# Patient Record
Sex: Male | Born: 1990 | Race: Black or African American | Hispanic: No | Marital: Single | Smoking: Never smoker
Health system: Southern US, Community
[De-identification: ages and names within clinical notes are randomized; demographics above are authoritative.]

---

## 2012-09-28 ENCOUNTER — Encounter (HOSPITAL_COMMUNITY): Payer: Self-pay | Admitting: Emergency Medicine

## 2012-09-28 ENCOUNTER — Emergency Department (INDEPENDENT_AMBULATORY_CARE_PROVIDER_SITE_OTHER)
Admission: EM | Admit: 2012-09-28 | Discharge: 2012-09-28 | Disposition: A | Discharge status: Home / Self Care | Payer: Commercial Managed Care - PPO | Source: Home / Self Care | Attending: Family Medicine | Admitting: Family Medicine

## 2012-09-28 DIAGNOSIS — J069 Acute upper respiratory infection, unspecified: Secondary | ICD-10-CM

## 2012-09-28 MED ORDER — FEXOFENADINE HCL 180 MG PO TABS: 180.0000 mg | ORAL_TABLET | Freq: Every day | ORAL | Status: AC

## 2012-09-28 MED ORDER — IPRATROPIUM BROMIDE 0.06 % NA SOLN: 2.0000 | Freq: Four times a day (QID) | NASAL | Status: AC

## 2012-09-28 NOTE — ED Provider Notes (Signed)
History     CSN: [id number]  Arrival date & time 09/28/12  1718   First MD Initiated Contact with Patient 09/28/12 1730      Chief Complaint  Patient presents with  . URI    (Consider location/radiation/quality/duration/timing/severity/associated sxs/prior treatment) Patient is a 22 y.o. male presenting with URI. The history is provided by the patient.  URI Presenting symptoms: congestion, rhinorrhea and sore throat   Presenting symptoms: no cough and no fever   Presenting symptoms comment:  Just back from spring break in [place] city, onset of sx yest Severity:  Mild Onset quality:  Gradual Duration:  1 day Progression:  Unchanged Chronicity:  New   History reviewed. No pertinent past medical history.  History reviewed. No pertinent past surgical history.  No family history on file.  History  Substance Use Topics  . Smoking status: Never Smoker   . Smokeless tobacco: Not on file  . Alcohol Use: Yes      Review of Systems  Constitutional: Negative for fever.  HENT: Positive for congestion, sore throat, rhinorrhea and postnasal drip.   Respiratory: Negative for cough.   Cardiovascular: Negative.   Gastrointestinal: Negative.   Genitourinary: Negative.     Allergies  Review of patient's allergies indicates no known allergies.  Home Medications   Current Outpatient Rx  Name  Route  Sig  Dispense  Refill  . acetaminophen (TYLENOL) 325 MG tablet   Oral   Take 650 mg by mouth every 6 (six) hours as needed for pain.         . diphenhydrAMINE (BENADRYL) 25 MG tablet   Oral   Take 25 mg by mouth every 6 (six) hours as needed for itching.         . Pseudoeph-Doxylamine-DM-APAP (NYQUIL PO)   Oral   Take by mouth.         . fexofenadine (ALLEGRA) 180 MG tablet   Oral   Take 1 tablet (180 mg total) by mouth daily.   30 tablet   1   . ipratropium (ATROVENT) 0.06 % nasal spray   Nasal   Place 2 sprays into the nose 4 (four) times daily.   15 mL  1     BP 143/67  Pulse 68  Temp(Src) 98.1 F (36.7 C) (Oral)  Resp 16  SpO2 97%  Physical Exam  Nursing note and vitals reviewed. Constitutional: He is oriented to person, place, and time. He appears well-developed and well-nourished.  HENT:  Head: Normocephalic.  Right Ear: External ear normal.  Left Ear: External ear normal.  Mouth/Throat: Oropharynx is clear and moist. No oropharyngeal exudate.  Eyes: Conjunctivae are normal. Pupils are equal, round, and reactive to light.  Neck: Normal range of motion. Neck supple.  Cardiovascular: Regular rhythm.   Pulmonary/Chest: Effort normal and breath sounds normal.  Abdominal: Soft. Bowel sounds are normal. There is no tenderness.  Lymphadenopathy:    He has no cervical adenopathy.  Neurological: He is alert and oriented to person, place, and time.  Skin: Skin is warm and dry.    ED Course  Procedures (including critical care time)  Labs Reviewed - No data to display No results found.   1. URI (upper respiratory infection)       MDM          Linna Hoff, MD 09/28/12 [phone]

## 2012-09-28 NOTE — ED Notes (Signed)
Yesterday noted sore throat, swelling in throat, feeling weak, chest and head congestion, denies fever

## 2014-12-20 ENCOUNTER — Encounter (HOSPITAL_COMMUNITY): Payer: Self-pay | Admitting: Emergency Medicine

## 2014-12-20 ENCOUNTER — Emergency Department (INDEPENDENT_AMBULATORY_CARE_PROVIDER_SITE_OTHER)
Admission: EM | Admit: 2014-12-20 | Discharge: 2014-12-20 | Disposition: A | Discharge status: Home / Self Care | Payer: BLUE CROSS/BLUE SHIELD | Source: Home / Self Care | Attending: Family Medicine | Admitting: Family Medicine

## 2014-12-20 ENCOUNTER — Emergency Department (INDEPENDENT_AMBULATORY_CARE_PROVIDER_SITE_OTHER): Payer: BLUE CROSS/BLUE SHIELD

## 2014-12-20 DIAGNOSIS — S93402A Sprain of unspecified ligament of left ankle, initial encounter: Secondary | ICD-10-CM | POA: Diagnosis not present

## 2014-12-20 NOTE — Discharge Instructions (Signed)
Thank you for coming in today. ° ° °Acute Ankle Sprain °with Phase I Rehab °An acute ankle sprain is a partial or complete tear in one or more of the ligaments of the ankle due to traumatic injury. The severity of the injury depends on both the number of ligaments sprained and the grade of sprain. There are 3 grades of sprains.  °· A grade 1 sprain is a mild sprain. There is a slight pull without obvious tearing. There is no loss of strength, and the muscle and ligament are the correct length. °· A grade 2 sprain is a moderate sprain. There is tearing of fibers within the substance of the ligament where it connects two bones or two cartilages. The length of the ligament is increased, and there is usually decreased strength. °· A grade 3 sprain is a complete rupture of the ligament and is uncommon. °In addition to the grade of sprain, there are three types of ankle sprains.  °Lateral ankle sprains: This is a sprain of one or more of the three ligaments on the outer side (lateral) of the ankle. These are the most common sprains. °Medial ankle sprains: There is one large triangular ligament of the inner side (medial) of the ankle that is susceptible to injury. Medial ankle sprains are less common. °Syndesmosis, "high ankle," sprains: The syndesmosis is the ligament that connects the two bones of the lower leg. Syndesmosis sprains usually only occur with very severe ankle sprains. °SYMPTOMS °· Pain, tenderness, and swelling in the ankle, starting at the side of injury that may progress to the whole ankle and foot with time. °· "Pop" or tearing sensation at the time of injury. °· Bruising that may spread to the heel. °· Impaired ability to walk soon after injury. °CAUSES  °· Acute ankle sprains are caused by trauma placed on the ankle that temporarily forces or pries the anklebone (talus) out of its normal socket. °· Stretching or tearing of the ligaments that normally hold the joint in place (usually due to a twisting  injury). °RISK INCREASES WITH: °· Previous ankle sprain. °· Sports in which the foot may land awkwardly (i.e., basketball, volleyball, or soccer) or walking or running on uneven or rough surfaces. °· Shoes with inadequate support to prevent sideways motion when stress occurs. °· Poor strength and flexibility. °· Poor balance skills. °· Contact sports. °PREVENTION  °· Warm up and stretch properly before activity. °· Maintain physical fitness: °¨ Ankle and leg flexibility, muscle strength, and endurance. °¨ Cardiovascular fitness. °· Balance training activities. °· Use proper technique and have a coach correct improper technique. °· Taping, protective strapping, bracing, or high-top tennis shoes may help prevent injury. Initially, tape is best; however, it loses most of its support function within 10 to 15 minutes. °· Wear proper-fitted protective shoes (High-top shoes with taping or bracing is more effective than either alone). °· Provide the ankle with support during sports and practice activities for 12 months following injury. °PROGNOSIS  °· If treated properly, ankle sprains can be expected to recover completely; however, the length of recovery depends on the degree of injury. °· A grade 1 sprain usually heals enough in 5 to 7 days to allow modified activity and requires an average of 6 weeks to heal completely. °· A grade 2 sprain requires 6 to 10 weeks to heal completely. °· A grade 3 sprain requires 12 to 16 weeks to heal. °· A syndesmosis sprain often takes more than 3 months to heal. °RELATED   COMPLICATIONS  °· Frequent recurrence of symptoms may result in a chronic problem. Appropriately addressing the problem the first time decreases the frequency of recurrence and optimizes healing time. Severity of the initial sprain does not predict the likelihood of later instability. °· Injury to other structures (bone, cartilage, or tendon). °· A chronically unstable or arthritic ankle joint is a possibility with  repeated sprains. °TREATMENT °Treatment initially involves the use of ice, medication, and compression bandages to help reduce pain and inflammation. Ankle sprains are usually immobilized in a walking cast or boot to allow for healing. Crutches may be recommended to reduce pressure on the injury. After immobilization, strengthening and stretching exercises may be necessary to regain strength and a full range of motion. Surgery is rarely needed to treat ankle sprains. °MEDICATION  °· Nonsteroidal anti-inflammatory medications, such as aspirin and ibuprofen (do not take for the first 3 days after injury or within 7 days before surgery), or other minor pain relievers, such as acetaminophen, are often recommended. Take these as directed by your caregiver. Contact your caregiver immediately if any bleeding, stomach upset, or signs of an allergic reaction occur from these medications. °· Ointments applied to the skin may be helpful. °· Pain relievers may be prescribed as necessary by your caregiver. Do not take prescription pain medication for longer than 4 to 7 days. Use only as directed and only as much as you need. °HEAT AND COLD °· Cold treatment (icing) is used to relieve pain and reduce inflammation for acute and chronic cases. Cold should be applied for 10 to 15 minutes every 2 to 3 hours for inflammation and pain and immediately after any activity that aggravates your symptoms. Use ice packs or an ice massage. °· Heat treatment may be used before performing stretching and strengthening activities prescribed by your caregiver. Use a heat pack or a warm soak. °SEEK IMMEDIATE MEDICAL CARE IF:  °· Pain, swelling, or bruising worsens despite treatment. °· You experience pain, numbness, discoloration, or coldness in the foot or toes. °· New, unexplained symptoms develop (drugs used in treatment may produce side effects.) °EXERCISES  °PHASE I EXERCISES °RANGE OF MOTION (ROM) AND STRETCHING EXERCISES - Ankle Sprain, Acute  Phase I, Weeks 1 to 2 °These exercises may help you when beginning to restore flexibility in your ankle. You will likely work on these exercises for the 1 to 2 weeks after your injury. Once your physician, physical therapist, or athletic trainer sees adequate progress, he or she will advance your exercises. While completing these exercises, remember:  °· Restoring tissue flexibility helps normal motion to return to the joints. This allows healthier, less painful movement and activity. °· An effective stretch should be held for at least 30 seconds. °· A stretch should never be painful. You should only feel a gentle lengthening or release in the stretched tissue. °RANGE OF MOTION - Dorsi/Plantar Flexion °· While sitting with your right / left knee straight, draw the top of your foot upwards by flexing your ankle. Then reverse the motion, pointing your toes downward. °· Hold each position for __________ seconds. °· After completing your first set of exercises, repeat this exercise with your knee bent. °Repeat __________ times. Complete this exercise __________ times per day.  °RANGE OF MOTION - Ankle Alphabet °· Imagine your right / left big toe is a pen. °· Keeping your hip and knee still, write out the entire alphabet with your "pen." Make the letters as large as you can without increasing any   discomfort. °Repeat __________ times. Complete this exercise __________ times per day.  °STRENGTHENING EXERCISES - Ankle Sprain, Acute -Phase I, Weeks 1 to 2 °These exercises may help you when beginning to restore strength in your ankle. You will likely work on these exercises for 1 to 2 weeks after your injury. Once your physician, physical therapist, or athletic trainer sees adequate progress, he or she will advance your exercises. While completing these exercises, remember:  °· Muscles can gain both the endurance and the strength needed for everyday activities through controlled exercises. °· Complete these exercises as  instructed by your physician, physical therapist, or athletic trainer. Progress the resistance and repetitions only as guided. °· You may experience muscle soreness or fatigue, but the pain or discomfort you are trying to eliminate should never worsen during these exercises. If this pain does worsen, stop and make certain you are following the directions exactly. If the pain is still present after adjustments, discontinue the exercise until you can discuss the trouble with your clinician. °STRENGTH - Dorsiflexors °· Secure a rubber exercise band/tubing to a fixed object (i.e., table, pole) and loop the other end around your right / left foot. °· Sit on the floor facing the fixed object. The band/tubing should be slightly tense when your foot is relaxed. °· Slowly draw your foot back toward you using your ankle and toes. °· Hold this position for __________ seconds. Slowly release the tension in the band and return your foot to the starting position. °Repeat __________ times. Complete this exercise __________ times per day.  °STRENGTH - Plantar-flexors  °· Sit with your right / left leg extended. Holding onto both ends of a rubber exercise band/tubing, loop it around the ball of your foot. Keep a slight tension in the band. °· Slowly push your toes away from you, pointing them downward. °· Hold this position for __________ seconds. Return slowly, controlling the tension in the band/tubing. °Repeat __________ times. Complete this exercise __________ times per day.  °STRENGTH - Ankle Eversion °· Secure one end of a rubber exercise band/tubing to a fixed object (table, pole). Loop the other end around your foot just before your toes. °· Place your fists between your knees. This will focus your strengthening at your ankle. °· Drawing the band/tubing across your opposite foot, slowly, pull your little toe out and up. Make sure the band/tubing is positioned to resist the entire motion. °· Hold this position for __________  seconds. °Have your muscles resist the band/tubing as it slowly pulls your foot back to the starting position.  °Repeat __________ times. Complete this exercise __________ times per day.  °STRENGTH - Ankle Inversion °· Secure one end of a rubber exercise band/tubing to a fixed object (table, pole). Loop the other end around your foot just before your toes. °· Place your fists between your knees. This will focus your strengthening at your ankle. °· Slowly, pull your big toe up and in, making sure the band/tubing is positioned to resist the entire motion. °· Hold this position for __________ seconds. °· Have your muscles resist the band/tubing as it slowly pulls your foot back to the starting position. °Repeat __________ times. Complete this exercises __________ times per day.  °STRENGTH - Towel Curls °· Sit in a chair positioned on a non-carpeted surface. °· Place your right / left foot on a towel, keeping your heel on the floor. °· Pull the towel toward your heel by only curling your toes. Keep your heel on the floor. °·   If instructed by your physician, physical therapist, or athletic trainer, add weight to the end of the towel. °Repeat __________ times. Complete this exercise __________ times per day. °Document Released: 02/05/2005 Document Revised: 11/21/2013 Document Reviewed: 10/19/2008 °[employer]® Patient Information ©2015 [employer]. This information is not intended to replace advice given to you by your health care provider. Make sure you discuss any questions you have with your health care provider. ° ° °Acute Ankle Sprain °with Phase II Rehab °An acute ankle sprain is a partial or complete tear in one or more of the ligaments of the ankle due to traumatic injury. The severity of the injury depends on both the number of ligaments sprained and the grade of sprain. There are 3 grades of sprains. °· A grade 1 sprain is a mild sprain. There is a slight pull without obvious tearing. There is no loss of  strength, and the muscle and ligament are the correct length. °· A grade 2 sprain is a moderate sprain. There is tearing of fibers within the substance of the ligament where it connects two bones or two cartilages. The length of the ligament is increased, and there is usually decreased strength. °· A grade 3 sprain is a complete rupture of the ligament and is uncommon. °In addition to the grade of sprain, there are 3 types of ankle sprains.  °Lateral ankle sprains. This is a sprain of one or more of the 3 ligaments on the outer side (lateral) of the ankle. These are the most common sprains. °Medial ankle sprains. There is one large triangular ligament on the inner side (medial) of the ankle that is susceptible to injury. Medial ankle sprains are less common. °Syndesmosis, "high ankle," sprains. The syndesmosis is the ligament that connects the two bones of the lower leg. Syndesmosis sprains usually only occur with very severe ankle sprains. °SYMPTOMS °· Pain, tenderness, and swelling in the ankle, starting at the side of injury that may progress to the whole ankle and foot with time. °· "Pop" or tearing sensation at the time of injury. °· Bruising that may spread to the heel. °· Impaired ability to walk soon after injury. °CAUSES  °· Acute ankle sprains are caused by trauma placed on the ankle that temporarily forces or pries the anklebone (talus) out of its normal socket. °· Stretching or tearing of the ligaments that normally hold the joint in place (usually due to a twisting injury). °RISK INCREASES WITH: °· Previous ankle sprain. °· Sports in which the foot may land awkwardly (basketball, volleyball, soccer) or walking or running on uneven or rough surfaces. °· Shoes with inadequate support to prevent sideways motion when stress occurs. °· Poor strength and flexibility. °· Poor balance skills. °· Contact sports. °PREVENTION °· Warm up and stretch properly before activity. °· Maintain physical fitness: °¨ Ankle  and leg flexibility, muscle strength, and endurance. °¨ Cardiovascular fitness. °· Balance training activities. °· Use proper technique and have a coach correct improper technique. °· Taping, protective strapping, bracing, or high-top tennis shoes may help prevent injury. Initially, tape is best. However, it loses most of its support function within 10 to 15 minutes. °· Wear proper fitted protective shoes. Combining high-top shoes with taping or bracing is more effective than using either alone. °· Provide the ankle with support during sports and practice activities for 12 months following injury. °PROGNOSIS  °· If treated properly, ankle sprains can be expected to recover completely. However, the length of recovery depends on the degree of   injury. °· A grade 1 sprain usually heals enough in 5 to 7 days to allow modified activity and requires an average of 6 weeks to heal completely. °· A grade 2 sprain requires 6 to 10 weeks to heal completely. °· A grade 3 sprain requires 12 to 16 weeks to heal. °· A syndesmosis sprain often takes more than 3 months to heal. °RELATED COMPLICATIONS  °· Frequent recurrence of symptoms may result in a chronic problem. Appropriately addressing the problem the first time decreases the frequency of recurrence and optimizes healing time. Severity of initial sprain does not predict the likelihood of later instability. °· Injury to other structures (bone, cartilage, or tendon). °· Chronically unstable or arthritic ankle joint are possible with repeated sprains. °TREATMENT °Treatment initially involves the use of ice, medicine, and compression bandages to help reduce pain and inflammation. Ankle sprains are usually immobilized in a walking cast or boot to allow for healing. Crutches may be recommended to reduce pressure on the injury. After immobilization, strengthening and stretching exercises may be necessary to regain strength and a full range of motion. Surgery is rarely needed to treat  ankle sprains. °MEDICATION  °· Nonsteroidal anti-inflammatory medicines, such as aspirin and ibuprofen (do not take for the first 3 days after injury or within 7 days before surgery), or other minor pain relievers, such as acetaminophen, are often recommended. Take these as directed by your caregiver. Contact your caregiver immediately if any bleeding, stomach upset, or signs of an allergic reaction occur from these medicines. °· Ointments applied to the skin may be helpful. °· Pain relievers may be prescribed as necessary by your caregiver. Do not take prescription pain medicine for longer than 4 to 7 days. Use only as directed and only as much as you need. °HEAT AND COLD °· Cold treatment (icing) is used to relieve pain and reduce inflammation for acute and chronic cases. Cold should be applied for 10 to 15 minutes every 2 to 3 hours for inflammation and pain and immediately after any activity that aggravates your symptoms. Use ice packs or an ice massage. °· Heat treatment may be used before performing stretching and strengthening activities prescribed by your caregiver. Use a heat pack or a warm soak. °SEEK IMMEDIATE MEDICAL CARE IF:  °· Pain, swelling, or bruising worsens despite treatment. °· You experience pain, numbness, discoloration, or coldness in the foot or toes. °· New, unexplained symptoms develop. (Drugs used in treatment may produce side effects.) °EXERCISES  °PHASE II EXERCISES °RANGE OF MOTION (ROM) AND STRETCHING EXERCISES - Ankle Sprain, Acute-Phase II, Weeks 3 to 4 °After your physician, physical therapist, or athletic trainer feels your knee has made progress significant enough to begin more advanced exercises, he or she may recommend completing some of the following exercises. Although each person heals at different rates, most people will be ready for these exercises between 3 and 4 weeks after their injury. Do not begin these exercises until you have your caregiver's permission. He or she  may also advise you to continue with the exercises which you completed in Phase I of your rehabilitation. While completing these exercises, remember:  °· Restoring tissue flexibility helps normal motion to return to the joints. This allows healthier, less painful movement and activity. °· An effective stretch should be held for at least 30 seconds. °· A stretch should never be painful. You should only feel a gentle lengthening or release in the stretched tissue. °RANGE OF MOTION - Ankle Plantar Flexion  °·   Sit with your right / left leg crossed over your opposite knee. °· Use your opposite hand to pull the top of your foot and toes toward you. °· You should feel a gentle stretch on the top of your foot/ankle. Hold this position for __________. °Repeat __________ times. Complete __________ times per day.  °RANGE OF MOTION - Ankle Eversion °· Sit with your right / left ankle crossed over your opposite knee. °· Grip your foot with your opposite hand, placing your thumb on the top of your foot and your fingers across the bottom of your foot. °· Gently push your foot downward with a slight rotation so your littlest toes rise slightly °· You should feel a gentle stretch on the inside of your ankle. Hold the stretch for __________ seconds. °Repeat __________ times. Complete this exercise __________ times per day.  °RANGE OF MOTION - Ankle Inversion °· Sit with your right / left ankle crossed over your opposite knee. °· Grip your foot with your opposite hand, placing your thumb on the bottom of your foot and your fingers across the top of your foot. °· Gently pull your foot so the smallest toe comes toward you and your thumb pushes the inside of the ball of your foot away from you. °· You should feel a gentle stretch on the outside of your ankle. Hold the stretch for __________ seconds. °Repeat __________ times. Complete this exercise __________ times per day.  °STRETCH - Gastrocsoleus °· Sit with your right / left leg  extended. Holding onto both ends of a belt or towel, loop it around the ball of your foot. °· Keeping your right / left ankle and foot relaxed and your knee straight, pull your foot and ankle toward you using the belt/towel. °· You should feel a gentle stretch behind your calf or knee. Hold this position for __________ seconds. °Repeat __________ times. Complete this stretch __________ times per day.  °RANGE OF MOTION - Ankle Dorsiflexion, Active Assisted °· Remove shoes and sit on a chair that is preferably not on a carpeted surface. °· Place right / left foot under knee. Extend your opposite leg for support. °· Keeping your heel down, slide your right / left foot back toward the chair until you feel a stretch at your ankle or calf. If you do not feel a stretch, slide your bottom forward to the edge of the chair while still keeping your heel down. °· Hold this stretch for __________ seconds. °Repeat __________ times. Complete this stretch __________ times per day.  °STRETCH - Gastroc, Standing  °· Place hands on wall. °· Extend right / left leg and place a folded washcloth under the arch of your foot for support. Keep the front knee somewhat bent. °· Slightly point your toes inward on your back foot. °· Keeping your right / left heel on the floor and your knee straight, shift your weight toward the wall, not allowing your back to arch. °· You should feel a gentle stretch in the calf. Hold this position for __________ seconds. °Repeat __________ times. Complete this stretch __________ times per day. °STRETCH - Soleus, Standing °· Place hands on wall. °· Extend right / left leg and place a folded washcloth under the arch of your foot for support. Keep the front knee somewhat bent. °· Slightly point your toes inward on your back foot. °· Keep your right / left heel on the floor, bend your back knee, and slightly shift your weight over the back leg so   that you feel a gentle stretch deep in your back calf. °· Hold this  position for __________ seconds. °Repeat __________ times. Complete this stretch __________ times per day. °STRETCH - Gastrocsoleus, Standing °Note: This exercise can place a lot of stress on your foot and ankle. Please complete this exercise only if specifically instructed by your caregiver.  °· Place the ball of your right / left foot on a step, keeping your other foot firmly on the same step. °· Hold on to the wall or a rail for balance. °· Slowly lift your other foot, allowing your body weight to press your heel down over the edge of the step. °· You should feel a stretch in your right / left calf. °· Hold this position for __________ seconds. °· Repeat this exercise with a slight bend in your knee. °Repeat __________ times. Complete this stretch __________ times per day.  °STRENGTHENING EXERCISES - Ankle Sprain, Acute-Phase II °Around 3 to 4 weeks after your injury, you may progress to some of these exercises in your rehabilitation program. Do not begin these until you have your caregiver's permission. Although your condition has improved, the Phase I exercises will continue to be helpful and you may continue to complete them. As you complete strengthening exercises, remember:  °· Strong muscles with good endurance tolerate stress better. °· Do the exercises as initially prescribed by your caregiver. Progress slowly with each exercise, gradually increasing the number of repetitions and weight used under his or her guidance. °· You may experience muscle soreness or fatigue, but the pain or discomfort you are trying to eliminate should never worsen during these exercises. If this pain does worsen, stop and make certain you are following the directions exactly. If the pain is still present after adjustments, discontinue the exercise until you can discuss the trouble with your caregiver. °STRENGTH - Plantar-flexors, Standing °· Stand with your feet shoulder width apart. Steady yourself with a wall or table using as  little support as needed. °· Keeping your weight evenly spread over the width of your feet, rise up on your toes.* °· Hold this position for __________ seconds. °Repeat __________ times. Complete this exercise __________ times per day.  °*If this is too easy, shift your weight toward your right / left leg until you feel challenged. Ultimately, you may be asked to do this exercise with your right / left foot only. °STRENGTH - Dorsiflexors and Plantar-flexors, Heel/toe Walking °· Dorsiflexion: Walk on your heels only. Keep your toes as high as possible. °· Walk for ____________________ seconds/feet. °· Repeat __________ times. Complete __________ times per day. °· Plantar flexion: Walk on your toes only. Keep your heels as high as possible. °· Walk for ____________________ seconds/feet. °Repeat __________ times. Complete __________ times per day.  °BALANCE - Tandem Walking °· Place your uninjured foot on a line 2 to 4 inches wide and at least 10 feet long. °· Keeping your balance without using anything for extra support, place your right / left heel directly in front of your other foot. °· Slowly raise your back foot up, lifting from the heel to the toes, and place it directly in front of the right / left foot. °· Continue to walk along the line slowly. Walk for ____________________ feet. °Repeat ____________________ times. Complete ____________________ times per day. °BALANCE - Inversion/Eversion °Use caution, these are advanced level exercises. Do not begin them until you are advised to do so.  °· Create a balance board using a sturdy board about 1 ½   feet long and at 1 to 1 ½ feet wide and a 1 ½ inch diameter rod or pipe that is as long as the board's width. A copper pipe or a solid broomstick work well. °· Stand on a non-carpeted surface near a countertop or wall. Step onto the board so that your feet are hip-width apart and equally straddle the rod/pipe. °· Keeping your feet in place, complete these two exercises  without shifting your upper body or hips: °¨ Tip the board from side-to-side. Control the movement so the board does not forcefully strike the ground. The board should silently tap the ground. °¨ Tip the board side-to-side without striking the ground. Occasionally pause and maintain a steady position at various points. °¨ Repeat the first two exercises, but use only your right / left foot. Place your right / left foot directly over the rod/pipe. °Repeat __________ times. Complete this exercise __________ times a day. °BALANCE - Plantar/Dorsi Flexion °Use caution, these are advanced level exercises. Do not begin them until you are advised to do so.  °· Create a balance board using a sturdy board about 1 ½ feet long and at 1 to 1 ½ feet wide and a 1 ½ inch diameter rod or pipe that is as long as the board's width. A copper pipe or a solid broomstick work well. °· Stand on a non-carpeted surface near a countertop or wall. Stand on the board so that the rod/pipe runs under the arches in your feet. °· Keeping your feet in place, complete these two exercises without shifting your upper body or hips: °¨ Tip the board from side-to-side. Control the movement so the board does not forcefully strike the ground. The board should silently tap the ground. °¨ Tip the board side-to-side without striking the ground. Occasionally pause and maintain a steady position at various points. °¨ Repeat the first two exercises, but use only your right / left foot. Stand in the center of the board. °Repeat __________ times. Complete this exercise __________ times a day. °STRENGTH - Plantar-flexors, Eccentric °Note: This exercise can place a lot of stress on your foot and ankle. Please complete this exercise only if specifically instructed by your caregiver.  °· Place the balls of your feet on a step. With your hands, use only enough support from a wall or rail to keep your balance. °· Keep your knees straight and rise up on your  toes. °· Slowly shift your weight entirely to your toes and pick up your opposite foot. Gently and with controlled movement, lower your weight through your right / left foot so that your heel drops below the level of the step. You will feel a slight stretch in the back of your calf at the ending position. °· Use the healthy leg to help rise up onto the balls of both feet, then lower weight only on the right / left leg again. Build up to 15 repetitions. Then progress to 3 consecutive sets of 15 repetitions.* °· After completing the above exercise, complete the same exercise with a slight knee bend (about 30 degrees). Again, build up to 15 repetitions. Then progress to 3 consecutive sets of 15 repetitions.* °Perform this exercise __________ times per day.  °*When you easily complete 3 sets of 15, your physician, physical therapist, or athletic trainer may advise you to add resistance by wearing a backpack filled with additional weight. °Document Released: 10/27/2005 Document Revised: 09/29/2011 Document Reviewed: 10/19/2008 °[employer]® Patient Information ©2015 [employer]. This information is   not intended to replace advice given to you by your health care provider. Make sure you discuss any questions you have with your health care provider. ° °

## 2014-12-20 NOTE — ED Notes (Signed)
C/o left ankle pain onset 2 weeks; recalls inj ankle while playing basketball Sx include persistent swelling and pain Alert, steady gait. No signs of acute distress.

## 2014-12-20 NOTE — ED Provider Notes (Signed)
Marvin Fernandez is a 24 y.o. male who presents to Urgent Care today for left ankle injury. Patient suffered an inversion injury to his left ankle while playing basketball 2 weeks ago. He has continued lateral ankle pain and swelling. He has done rest ice elevation and ibuprofen which has helped a little. He uses an ankle brace which helps. Pain is worse with activity.   History reviewed. No pertinent past medical history. History reviewed. No pertinent past surgical history. History  Substance Use Topics  . Smoking status: Never Smoker   . Smokeless tobacco: Not on file  . Alcohol Use: Yes   ROS as above Medications: No current facility-administered medications for this encounter.   Current Outpatient Prescriptions  Medication Sig Dispense Refill  . acetaminophen (TYLENOL) 325 MG tablet Take 650 mg by mouth every 6 (six) hours as needed for pain.    . diphenhydrAMINE (BENADRYL) 25 MG tablet Take 25 mg by mouth every 6 (six) hours as needed for itching.    . fexofenadine (ALLEGRA) 180 MG tablet Take 1 tablet (180 mg total) by mouth daily. 30 tablet 1  . ipratropium (ATROVENT) 0.06 % nasal spray Place 2 sprays into the nose 4 (four) times daily. 15 mL 1  . Pseudoeph-Doxylamine-DM-APAP (NYQUIL PO) Take by mouth.     No Known Allergies   Exam:  BP 116/69 mmHg  Pulse 64  Temp(Src) 98.4 F (36.9 C) (Oral)  Resp 16  SpO2 96% Gen: Well NAD HEENT: EOMI,  MMM Lungs: Normal work of breathing. CTABL Heart: RRR no MRG Abd: NABS, Soft. Nondistended, Nontender Exts: Brisk capillary refill, warm and well perfused.  Lower extremity left-sided: Knee nontender normal motion stable ligamentous exam Ankle: Swollen tender lateral malleolus. Stable ligamentous exam normal motion. Excellent foot nontender normal appearing normal pulses capillary refill and sensation  No results found for this or any previous visit (from the past 24 hour(s)). Dg Ankle Complete Left  12/20/2014   CLINICAL DATA:   Injured ankle 2 weeks ago playing basketball. Persistent pain laterally  EXAM: LEFT ANKLE COMPLETE - 3+ VIEW  COMPARISON:  None.  FINDINGS: Frontal, oblique, and lateral views obtained. There is mild swelling laterally. There is no fracture or effusion. The ankle mortise appears intact. There is no appreciable joint space narrowing.  IMPRESSION: No fracture.  Mortise intact.  There is mild swelling laterally.   Electronically Signed   By: Bretta BangWilliam  Woodruff III M.D.   On: 12/20/2014 13:57    Assessment and Plan: 24 y.o. male with ankle sprain. ASO brace and NSAIDs rest ice and elevation.  Discussed warning signs or symptoms. Please see discharge instructions. Patient expresses understanding.     Rodolph BongEvan S Nihaal Friesen, MD 12/20/14 [phone]

## 2015-11-22 ENCOUNTER — Ambulatory Visit (HOSPITAL_COMMUNITY)
Admission: EM | Admit: 2015-11-22 | Discharge: 2015-11-22 | Disposition: A | Discharge status: Home / Self Care | Payer: BLUE CROSS/BLUE SHIELD | Attending: Emergency Medicine | Admitting: Emergency Medicine

## 2015-11-22 ENCOUNTER — Encounter (HOSPITAL_COMMUNITY): Payer: Self-pay | Admitting: Emergency Medicine

## 2015-11-22 DIAGNOSIS — S46912A Strain of unspecified muscle, fascia and tendon at shoulder and upper arm level, left arm, initial encounter: Secondary | ICD-10-CM

## 2015-11-22 DIAGNOSIS — T148 Other injury of unspecified body region: Secondary | ICD-10-CM

## 2015-11-22 DIAGNOSIS — S7002XA Contusion of left hip, initial encounter: Secondary | ICD-10-CM

## 2015-11-22 DIAGNOSIS — S40812A Abrasion of left upper arm, initial encounter: Secondary | ICD-10-CM

## 2015-11-22 DIAGNOSIS — T148XXA Other injury of unspecified body region, initial encounter: Secondary | ICD-10-CM

## 2015-11-22 MED ORDER — MELOXICAM 15 MG PO TABS: 15.0000 mg | ORAL_TABLET | Freq: Every day | ORAL | Status: AC

## 2015-11-22 MED ORDER — CYCLOBENZAPRINE HCL 10 MG PO TABS: 10.0000 mg | ORAL_TABLET | Freq: Three times a day (TID) | ORAL | Status: AC | PRN

## 2015-11-22 MED ORDER — TRAMADOL HCL 50 MG PO TABS: 50.0000 mg | ORAL_TABLET | Freq: Four times a day (QID) | ORAL | Status: AC | PRN
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
## 2015-11-22 NOTE — Discharge Instructions (Signed)
There is no sign of broken bones. You have a lot of muscle strain and bruising. Apply ice for the next 2 days, after that use heat. Take meloxicam daily to help with pain. You can take Tylenol with this, but no ibuprofen. Use the Flexeril 3 times a day as needed for muscle spasms. This medicine may make you drowsy. Use the tramadol as needed for severe pain. Do not drive or work while taking this medicine. This make it a little worse before it gets better. Follow-up as needed.

## 2016-06-11 IMAGING — DX DG ANKLE COMPLETE 3+V*L*
3 series · 3 of 3 positions shown · non-contrast
Comparison: None.

CLINICAL DATA: Injured ankle 2 weeks ago playing basketball.
Persistent pain laterally

EXAM:
LEFT ANKLE COMPLETE - 3+ VIEW

[ankle ap]
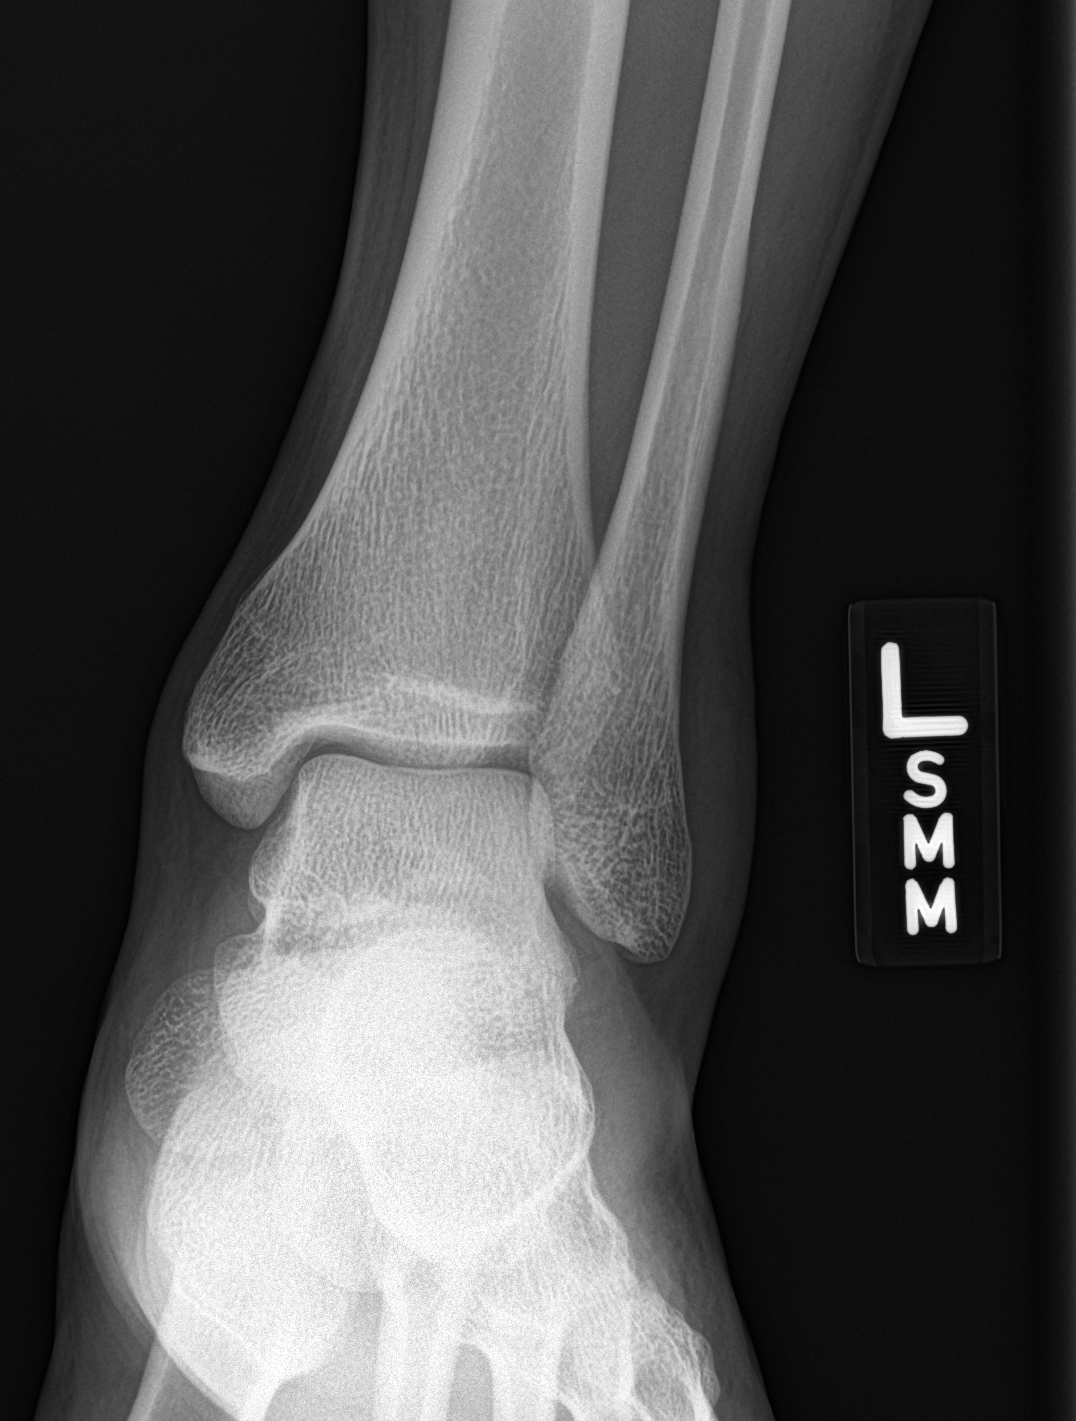

[ankle obl]
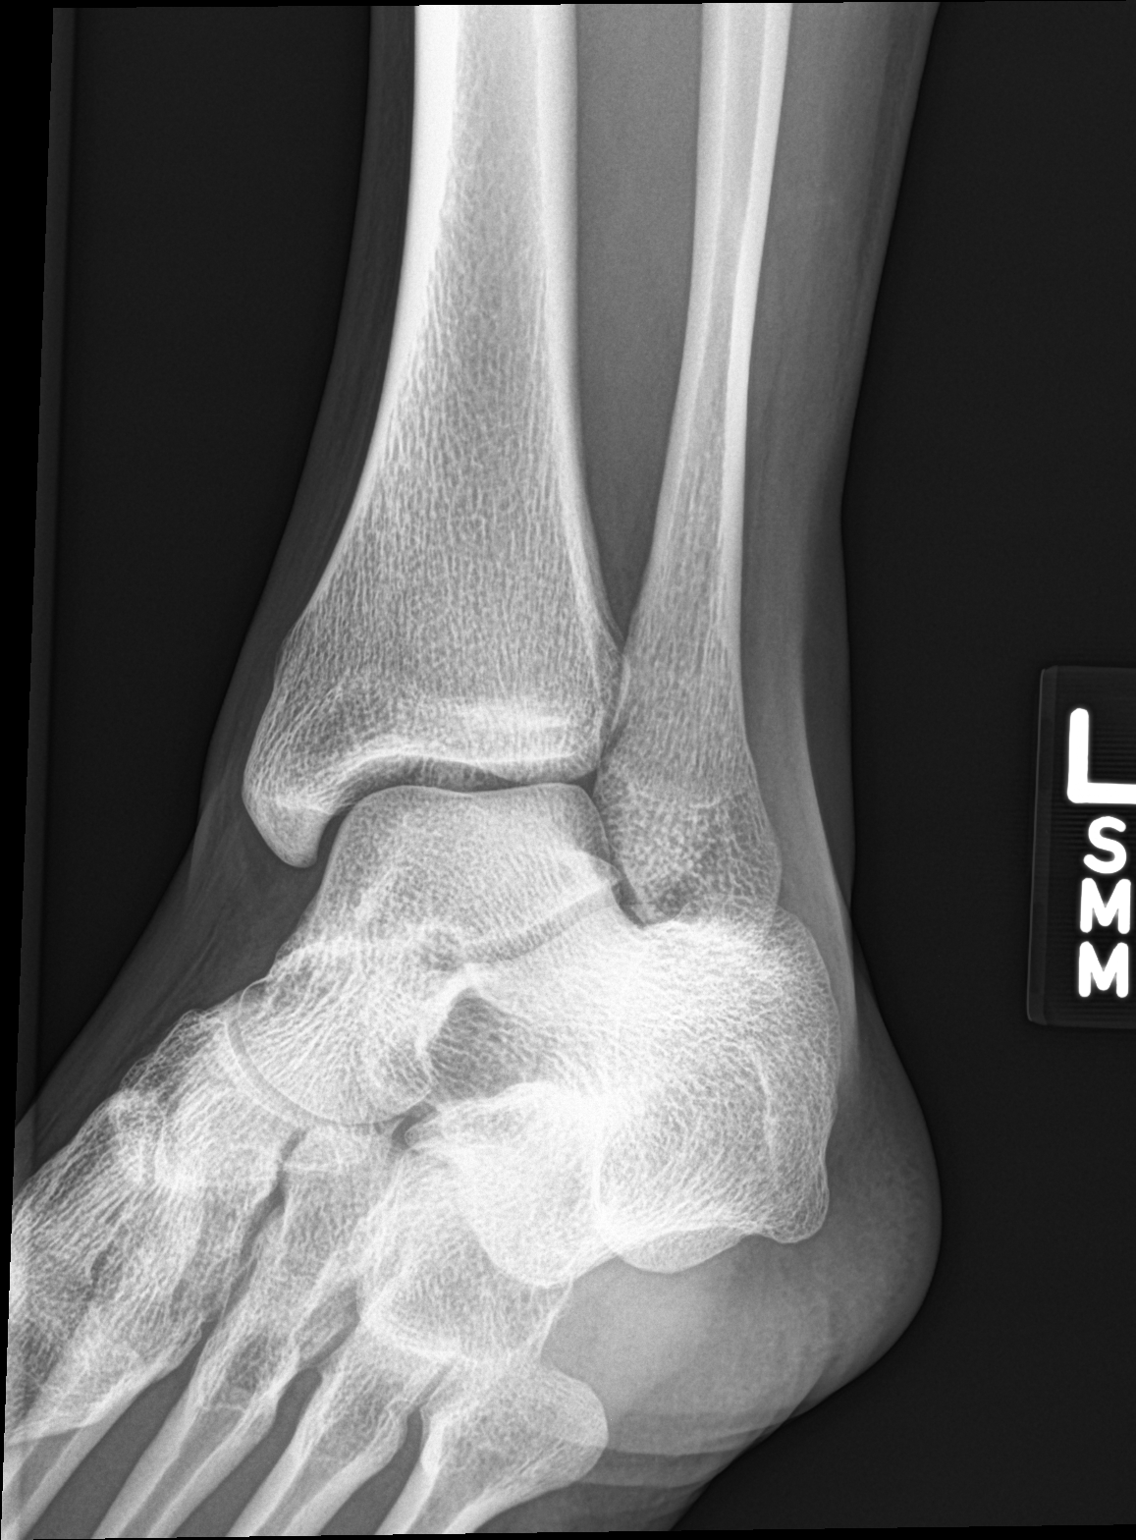

[ankle lat]
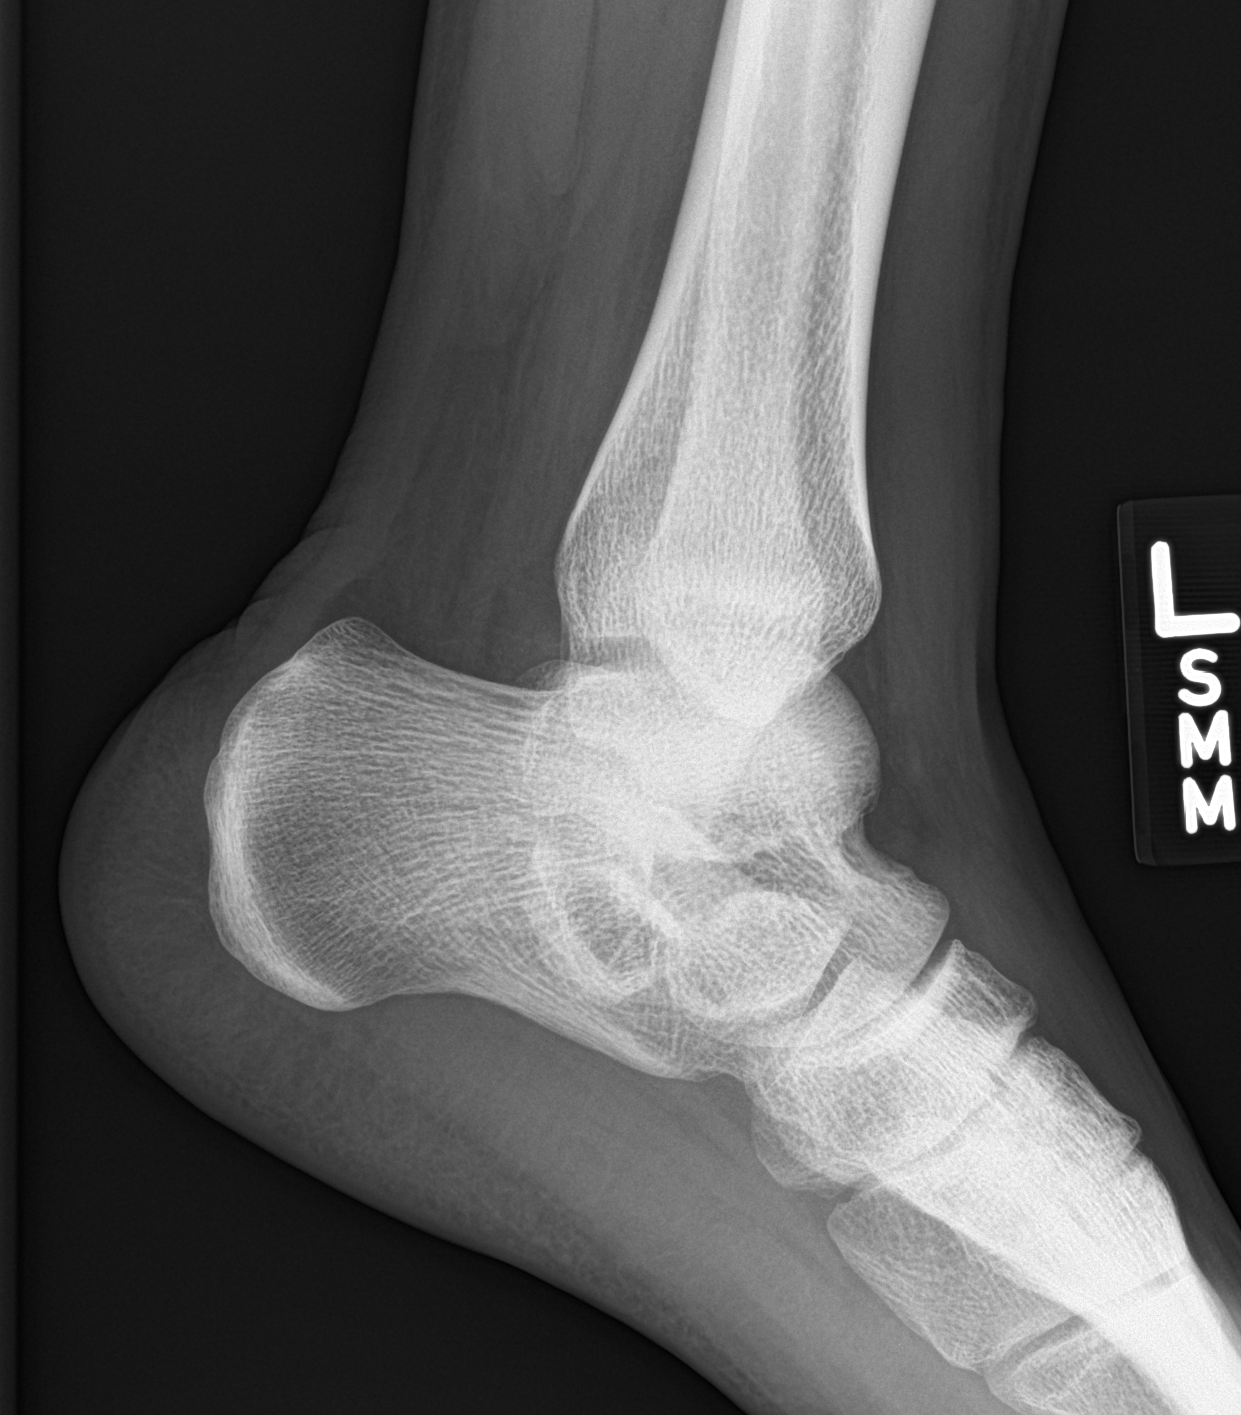

[3 of 3 positions shown; findings below may reference images not displayed]

FINDINGS: Frontal, oblique, and lateral views obtained. There is mild swelling
laterally. There is no fracture or effusion. The ankle mortise
appears intact. There is no appreciable joint space narrowing.
IMPRESSION: No fracture.  Mortise intact.  There is mild swelling laterally.
